# Patient Record
Sex: Female | Born: 1985 | Race: White | Hispanic: No | Marital: Single | Smoking: Current every day smoker
Health system: Southern US, Community
[De-identification: ages and names within clinical notes are randomized; demographics above are authoritative.]

## PROBLEM LIST (undated history)

## (undated) DIAGNOSIS — E785 Hyperlipidemia, unspecified: Secondary | ICD-10-CM

## (undated) DIAGNOSIS — G43909 Migraine, unspecified, not intractable, without status migrainosus: Secondary | ICD-10-CM

## (undated) DIAGNOSIS — M1990 Unspecified osteoarthritis, unspecified site: Secondary | ICD-10-CM

## (undated) DIAGNOSIS — R000 Tachycardia, unspecified: Secondary | ICD-10-CM

## (undated) DIAGNOSIS — M797 Fibromyalgia: Secondary | ICD-10-CM

## (undated) DIAGNOSIS — J40 Bronchitis, not specified as acute or chronic: Secondary | ICD-10-CM

## (undated) DIAGNOSIS — F4310 Post-traumatic stress disorder, unspecified: Secondary | ICD-10-CM

## (undated) DIAGNOSIS — G4700 Insomnia, unspecified: Secondary | ICD-10-CM

## (undated) DIAGNOSIS — K589 Irritable bowel syndrome without diarrhea: Secondary | ICD-10-CM

## (undated) DIAGNOSIS — F419 Anxiety disorder, unspecified: Secondary | ICD-10-CM

## (undated) DIAGNOSIS — J45909 Unspecified asthma, uncomplicated: Secondary | ICD-10-CM

## (undated) DIAGNOSIS — I10 Essential (primary) hypertension: Secondary | ICD-10-CM

## (undated) DIAGNOSIS — K219 Gastro-esophageal reflux disease without esophagitis: Secondary | ICD-10-CM

## (undated) HISTORY — PX: FINGER SURGERY: SHX640

## (undated) HISTORY — DX: Tachycardia, unspecified: R00.0

## (undated) HISTORY — DX: Post-traumatic stress disorder, unspecified: F43.10

## (undated) HISTORY — PX: WISDOM TOOTH EXTRACTION: SHX21

## (undated) HISTORY — DX: Gastro-esophageal reflux disease without esophagitis: K21.9

## (undated) HISTORY — DX: Insomnia, unspecified: G47.00

---
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
## 2004-09-15 ENCOUNTER — Ambulatory Visit: Payer: Self-pay | Admitting: Family Medicine

## 2005-08-12 ENCOUNTER — Ambulatory Visit: Payer: Self-pay | Admitting: Family Medicine

## 2009-02-23 ENCOUNTER — Emergency Department (HOSPITAL_COMMUNITY): Admission: EM | Admit: 2009-02-23 | Discharge: 2009-02-23 | Payer: Self-pay | Admitting: Emergency Medicine
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
## 2015-12-08 ENCOUNTER — Encounter (HOSPITAL_COMMUNITY): Payer: Self-pay | Admitting: Emergency Medicine

## 2015-12-08 ENCOUNTER — Emergency Department (HOSPITAL_COMMUNITY)
Admission: EM | Admit: 2015-12-08 | Discharge: 2015-12-08 | Disposition: A | Discharge status: Home / Self Care | Payer: Medicaid Other | Attending: Emergency Medicine | Admitting: Emergency Medicine

## 2015-12-08 DIAGNOSIS — F419 Anxiety disorder, unspecified: Secondary | ICD-10-CM | POA: Insufficient documentation

## 2015-12-08 DIAGNOSIS — J45909 Unspecified asthma, uncomplicated: Secondary | ICD-10-CM | POA: Insufficient documentation

## 2015-12-08 DIAGNOSIS — R290 Tetany: Secondary | ICD-10-CM | POA: Diagnosis not present

## 2015-12-08 DIAGNOSIS — E785 Hyperlipidemia, unspecified: Secondary | ICD-10-CM | POA: Diagnosis not present

## 2015-12-08 DIAGNOSIS — F17200 Nicotine dependence, unspecified, uncomplicated: Secondary | ICD-10-CM | POA: Diagnosis not present

## 2015-12-08 DIAGNOSIS — I10 Essential (primary) hypertension: Secondary | ICD-10-CM | POA: Diagnosis not present

## 2015-12-08 DIAGNOSIS — R202 Paresthesia of skin: Secondary | ICD-10-CM | POA: Diagnosis present

## 2015-12-08 HISTORY — DX: Fibromyalgia: M79.7

## 2015-12-08 HISTORY — DX: Bronchitis, not specified as acute or chronic: J40

## 2015-12-08 HISTORY — DX: Unspecified asthma, uncomplicated: J45.909

## 2015-12-08 HISTORY — DX: Migraine, unspecified, not intractable, without status migrainosus: G43.909

## 2015-12-08 HISTORY — DX: Irritable bowel syndrome without diarrhea: K58.9

## 2015-12-08 HISTORY — DX: Essential (primary) hypertension: I10

## 2015-12-08 HISTORY — DX: Anxiety disorder, unspecified: F41.9

## 2015-12-08 HISTORY — DX: Unspecified osteoarthritis, unspecified site: M19.90

## 2015-12-08 HISTORY — DX: Hyperlipidemia, unspecified: E78.5

## 2015-12-08 NOTE — ED Notes (Signed)
Sudden onset of tingling all over, hands cramping, chest pain, SOB,  feelings of anxiety at approximately 12 noon. Pt drove herself to urgent care who then called EMS due to  EKG machine not working. C/O feeling tense and jittery at this time. No pain.

## 2015-12-08 NOTE — ED Provider Notes (Signed)
CSN: [id number]     Arrival date & time 12/08/15  1335 History   First MD Initiated Contact with Patient 12/08/15 1513     Chief Complaint  Patient presents with  . Anxiety     HPI Patient presents to the emergency department with complaints of tingling and cramping throughout her hands after he is coming up to the level of her chest.  She also reports a tingling sensation around her mouth.  She states over the past year she is intermittently had issues where she develops inability to flex or extend at the thumbs right greater than left.  She's currently on Neurontin.  She has seen a neurologist in the past but no clear answer was given as to her intermittent symptoms.  She states she occasionally has a spasm-like pain in the right lateral cervical spine region.  She carries a diagnosis of fibromyalgia, anxiety, IBS, arthritis, migraine.  She states her medications all seem to help.  She went to an urgent care today when her symptoms became worse and the EKG machine was not working and she was complaining of chest discomfort at the time thus they sent her to the ER for evaluation.  Patient reports she is feeling much better at this time.  No chest pain shortness breath.  No fevers or chills.   Past Medical History  Diagnosis Date  . Asthma   . Arthritis   . Hypertension   . Fibromyalgia   . Anxiety   . Irritable bowel syndrome   . Bronchitis   . Migraine   . Hyperlipidemia    Past Surgical History  Procedure Laterality Date  . Finger surgery     No family history on file. Social History  Substance Use Topics  . Smoking status: Current Every Day Smoker  . Smokeless tobacco: None  . Alcohol Use: Yes   OB History    No data available     Review of Systems  All other systems reviewed and are negative.     Allergies  Mucinex  Home Medications   Prior to Admission medications   Not on File   BP 175/99 mmHg  Pulse 84  Temp(Src) 98 F (36.7 C) (Oral)  Resp 20  Ht 5'  5" (1.651 m)  Wt 200 lb (90.719 kg)  BMI 33.28 kg/m2  SpO2 100%  LMP 12/08/2015 Physical Exam  Constitutional: She is oriented to person, place, and time. She appears well-developed and well-nourished. No distress.  HENT:  Head: Normocephalic and atraumatic.  Eyes: EOM are normal.  Neck: Normal range of motion.  C-spine nontender.  No cervical step-off.  Cardiovascular: Normal rate, regular rhythm and normal heart sounds.   Pulmonary/Chest: Effort normal and breath sounds normal.  Abdominal: Soft. She exhibits no distension. There is no tenderness.  Musculoskeletal: Normal range of motion.  5 out of 5 strength in bilateral upper extremity major muscle groups.  Normal grip strength bilaterally.  Neurological: She is alert and oriented to person, place, and time.  Skin: Skin is warm and dry.  Psychiatric: She has a normal mood and affect. Judgment normal.  Nursing note and vitals reviewed.   ED Course  Procedures (including critical care time) Labs Review Labs Reviewed - No data to display  Imaging Review No results found. I have personally reviewed and evaluated these images and lab results as part of my medical decision-making.   EKG Interpretation   Date/Time:  Tuesday December 08 2015 13:39:31 EST Ventricular Rate:  1986  PR Interval:  138 QRS Duration: 86 QT Interval:  404 QTC Calculation: 483 R Axis:   51 Text Interpretation:  Normal sinus rhythm with sinus arrhythmia  Nonspecific ST abnormality Prolonged QT Abnormal ECG No old tracing to  compare Confirmed by Shelvia Fojtik  MD, Caryn Bee ([id number]) on 12/08/2015 3:13:59 PM      MDM   Final diagnoses:  Carpopedal spasm    Patiently to follow-up as an outpatient with neurology as she may benefit from nerve conduction testing.  This is been intermittent issue for over a year.  Her episode today sounds like it began without followed by a panic attack with associated carpopedal spasm and hyperventilation.  She is much better at this  time.  Doubt stroke.  Doubt ACS.  No indication for additional workup in the emergency department today.    Azalia Bilis, MD 12/08/15 [id number]

## 2015-12-10 ENCOUNTER — Ambulatory Visit: Payer: Self-pay | Admitting: Neurology

## 2015-12-10 ENCOUNTER — Encounter: Payer: Self-pay | Admitting: Neurology

## 2015-12-10 ENCOUNTER — Telehealth: Payer: Self-pay | Admitting: *Deleted

## 2015-12-10 ENCOUNTER — Ambulatory Visit (INDEPENDENT_AMBULATORY_CARE_PROVIDER_SITE_OTHER): Payer: Medicaid Other | Admitting: Neurology

## 2015-12-10 VITALS — BP 140/102 | HR 84 | Resp 20 | Ht 65.0 in | Wt 203.0 lb

## 2015-12-10 DIAGNOSIS — R252 Cramp and spasm: Secondary | ICD-10-CM | POA: Diagnosis not present

## 2015-12-10 DIAGNOSIS — R200 Anesthesia of skin: Secondary | ICD-10-CM

## 2015-12-10 DIAGNOSIS — R4781 Slurred speech: Secondary | ICD-10-CM | POA: Insufficient documentation

## 2015-12-10 MED ORDER — GABAPENTIN 100 MG PO CAPS: 300.0000 mg | ORAL_CAPSULE | Freq: Three times a day (TID) | ORAL | Status: DC

## 2015-12-10 NOTE — Telephone Encounter (Signed)
Called pt to see if she would be willing to r/s to 4pm to Dr Roda ShuttersXu. She stated she has to pick up her kid from school and this is too late for her. She has a 50 min drive to our office. Advised for her to arrive at 1230pm for 1pm appt. Bring insurance card/medications w/ her to appt. She verbalized understanding.
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
## 2015-12-20 ENCOUNTER — Ambulatory Visit
Admission: RE | Admit: 2015-12-20 | Discharge: 2015-12-20 | Disposition: A | Discharge status: Home / Self Care | Payer: Medicaid Other | Source: Ambulatory Visit | Attending: Neurology | Admitting: Neurology

## 2015-12-20 DIAGNOSIS — R4781 Slurred speech: Secondary | ICD-10-CM | POA: Diagnosis not present

## 2015-12-20 DIAGNOSIS — R200 Anesthesia of skin: Secondary | ICD-10-CM

## 2015-12-20 DIAGNOSIS — R252 Cramp and spasm: Secondary | ICD-10-CM

## 2015-12-21 ENCOUNTER — Encounter: Payer: Self-pay | Admitting: Neurology

## 2015-12-21 ENCOUNTER — Telehealth: Payer: Self-pay | Admitting: Neurology

## 2015-12-21 NOTE — Telephone Encounter (Signed)
MRI not read yet, patient notified.

## 2015-12-21 NOTE — Telephone Encounter (Signed)
Patient called to request results of 12/20/15 MRI, [facility] advised patient that MRI was sent instantly to Dr. Terrace ArabiaYan.

## 2015-12-21 NOTE — Telephone Encounter (Signed)
I have personally reviewed MRI of the brain, lumbar, cervical spine, there was no significant abnormality, formal report is pending, will release the result to my chart

## 2015-12-31 ENCOUNTER — Ambulatory Visit (INDEPENDENT_AMBULATORY_CARE_PROVIDER_SITE_OTHER): Payer: Medicaid Other | Admitting: Neurology

## 2015-12-31 ENCOUNTER — Ambulatory Visit: Payer: Medicaid Other | Admitting: Neurology

## 2015-12-31 ENCOUNTER — Encounter: Payer: Self-pay | Admitting: Neurology

## 2015-12-31 VITALS — BP 142/99 | HR 83 | Resp 18 | Ht 65.0 in | Wt 207.0 lb

## 2015-12-31 DIAGNOSIS — R200 Anesthesia of skin: Secondary | ICD-10-CM

## 2015-12-31 DIAGNOSIS — R202 Paresthesia of skin: Secondary | ICD-10-CM | POA: Diagnosis not present

## 2015-12-31 DIAGNOSIS — R4781 Slurred speech: Secondary | ICD-10-CM
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
## 2018-07-17 ENCOUNTER — Ambulatory Visit: Payer: Self-pay | Admitting: Neurology

## 2018-07-17 ENCOUNTER — Encounter: Payer: Self-pay | Admitting: Neurology

## 2018-07-17 ENCOUNTER — Ambulatory Visit: Payer: Medicaid Other | Admitting: Neurology

## 2018-07-17 VITALS — BP 130/96 | HR 84 | Ht 65.0 in | Wt 191.0 lb

## 2018-07-17 DIAGNOSIS — IMO0002 Reserved for concepts with insufficient information to code with codable children: Secondary | ICD-10-CM

## 2018-07-17 DIAGNOSIS — M545 Low back pain, unspecified: Secondary | ICD-10-CM | POA: Insufficient documentation

## 2018-07-17 DIAGNOSIS — G43709 Chronic migraine without aura, not intractable, without status migrainosus: Secondary | ICD-10-CM

## 2018-07-17 DIAGNOSIS — R200 Anesthesia of skin: Secondary | ICD-10-CM | POA: Diagnosis not present

## 2018-07-17 MED ORDER — RIZATRIPTAN BENZOATE 5 MG PO TBDP: 5.0000 mg | ORAL_TABLET | ORAL | 6 refills | Status: DC | PRN
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
## 2018-07-17 NOTE — Patient Instructions (Signed)
Magnesium oxide 453m  Riboflavin 100mg  twice a day as migraine prevention

## 2018-07-18 LAB — CK: CK TOTAL: 79 U/L (ref 24–173)

## 2018-07-18 LAB — VITAMIN D 25 HYDROXY (VIT D DEFICIENCY, FRACTURES): VIT D 25 HYDROXY: 33.7 ng/mL (ref 30.0–100.0)

## 2018-07-18 LAB — THYROID PANEL WITH TSH
FREE THYROXINE INDEX: 2.3 (ref 1.2–4.9)
T3 UPTAKE RATIO: 27 % (ref 24–39)
T4 TOTAL: 8.4 ug/dL (ref 4.5–12.0)
TSH: 1.9 u[IU]/mL (ref 0.450–4.500)

## 2018-10-22 ENCOUNTER — Other Ambulatory Visit (HOSPITAL_COMMUNITY): Payer: Self-pay | Admitting: *Deleted

## 2018-10-23 ENCOUNTER — Ambulatory Visit (HOSPITAL_COMMUNITY)
Admission: RE | Admit: 2018-10-23 | Discharge: 2018-10-23 | Disposition: A | Discharge status: Home / Self Care | Payer: Medicaid Other | Source: Ambulatory Visit | Attending: Nephrology | Admitting: Nephrology

## 2018-10-23 MED ORDER — MAGNESIUM SULFATE 4 GM/100ML IV SOLN
4.0000 g | Freq: Once | INTRAVENOUS | Status: AC
  Administered 2018-10-23: 4 g via INTRAVENOUS
  Filled 2018-10-23: qty 100

## 2018-11-19 ENCOUNTER — Encounter (HOSPITAL_COMMUNITY): Payer: Self-pay

## 2018-11-19 ENCOUNTER — Encounter (HOSPITAL_COMMUNITY)
Admission: RE | Admit: 2018-11-19 | Discharge: 2018-11-19 | Disposition: A | Discharge status: Home / Self Care | Payer: Medicaid Other | Source: Ambulatory Visit | Attending: Nephrology | Admitting: Nephrology

## 2018-11-19 MED ORDER — SODIUM CHLORIDE 0.9 % IV SOLN
Freq: Once | INTRAVENOUS | Status: AC
  Administered 2018-11-19: 09:00:00 via INTRAVENOUS

## 2018-11-19 MED ORDER — MAGNESIUM SULFATE 4 GM/100ML IV SOLN
4.0000 g | Freq: Once | INTRAVENOUS | Status: AC
  Administered 2018-11-19: 4 g via INTRAVENOUS
  Filled 2018-11-19: qty 100

## 2018-11-21 ENCOUNTER — Encounter (HOSPITAL_COMMUNITY): Payer: Medicaid Other

## 2019-01-16 ENCOUNTER — Encounter: Payer: Self-pay | Admitting: *Deleted

## 2019-01-16 ENCOUNTER — Telehealth: Payer: Self-pay | Admitting: Neurology

## 2019-01-16 NOTE — Telephone Encounter (Signed)
Pt called in and stated she has been in a lot of pain in her lower back and in her groin area she states its hard for her to lift her leg up also

## 2019-01-16 NOTE — Telephone Encounter (Signed)
I returned the call to the patient.  She is having increased pain and would like to be seen.  She has been scheduled for a virtual visit with Dr. Terrace Arabia on 01/17/2019 at 9:30am. Her medication list has been updated.

## 2019-01-17 ENCOUNTER — Encounter: Payer: Self-pay | Admitting: Neurology

## 2019-01-17 ENCOUNTER — Ambulatory Visit (INDEPENDENT_AMBULATORY_CARE_PROVIDER_SITE_OTHER): Payer: Medicaid Other | Admitting: Neurology

## 2019-01-17 ENCOUNTER — Other Ambulatory Visit: Payer: Self-pay

## 2019-01-17 DIAGNOSIS — G43709 Chronic migraine without aura, not intractable, without status migrainosus: Secondary | ICD-10-CM

## 2019-01-17 DIAGNOSIS — M545 Low back pain, unspecified: Secondary | ICD-10-CM

## 2019-01-17 DIAGNOSIS — IMO0002 Reserved for concepts with insufficient information to code with codable children: Secondary | ICD-10-CM

## 2019-01-17 MED ORDER — GABAPENTIN 300 MG PO CAPS: 300.0000 mg | ORAL_CAPSULE | Freq: Three times a day (TID) | ORAL | 11 refills | Status: DC

## 2019-01-17 MED ORDER — RIZATRIPTAN BENZOATE 5 MG PO TBDP: 5.0000 mg | ORAL_TABLET | ORAL | 11 refills | Status: DC | PRN
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
## 2019-02-13 ENCOUNTER — Telehealth: Payer: Self-pay | Admitting: Neurology

## 2019-02-13 NOTE — Telephone Encounter (Signed)
-----   Message from Levert Feinstein, MD sent at 01/17/2019  9:45 AM EDT ----- prn

## 2019-02-13 NOTE — Telephone Encounter (Signed)
Patient will follow as needed.  °

## 2019-07-08 ENCOUNTER — Encounter (HOSPITAL_COMMUNITY): Payer: Medicaid Other

## 2019-07-08 ENCOUNTER — Other Ambulatory Visit (HOSPITAL_COMMUNITY): Payer: Self-pay

## 2019-07-09 ENCOUNTER — Other Ambulatory Visit: Payer: Self-pay

## 2019-07-09 ENCOUNTER — Ambulatory Visit (HOSPITAL_COMMUNITY)
Admission: RE | Admit: 2019-07-09 | Discharge: 2019-07-09 | Disposition: A | Discharge status: Home / Self Care | Payer: Medicaid Other | Source: Ambulatory Visit | Attending: Nephrology | Admitting: Nephrology

## 2019-07-09 MED ORDER — MAGNESIUM SULFATE 4 GM/100ML IV SOLN
4.0000 g | Freq: Once | INTRAVENOUS | Status: AC
  Administered 2019-07-09: 4 g via INTRAVENOUS
  Filled 2019-07-09: qty 100

## 2019-07-26 ENCOUNTER — Other Ambulatory Visit: Payer: Self-pay | Admitting: Neurology

## 2019-08-06 ENCOUNTER — Other Ambulatory Visit (HOSPITAL_COMMUNITY): Payer: Self-pay | Admitting: *Deleted

## 2019-08-07 ENCOUNTER — Encounter (HOSPITAL_COMMUNITY)
Admission: RE | Admit: 2019-08-07 | Discharge: 2019-08-07 | Disposition: A | Discharge status: Home / Self Care | Payer: Medicaid Other | Source: Ambulatory Visit | Attending: Nephrology | Admitting: Nephrology

## 2019-08-07 ENCOUNTER — Other Ambulatory Visit: Payer: Self-pay

## 2019-08-07 MED ORDER — MAGNESIUM SULFATE 4 GM/100ML IV SOLN
4.0000 g | Freq: Once | INTRAVENOUS | Status: AC
  Administered 2019-08-07: 4 g via INTRAVENOUS
  Filled 2019-08-07: qty 100

## 2020-02-05 ENCOUNTER — Other Ambulatory Visit: Payer: Self-pay | Admitting: Neurology

## 2020-02-10 ENCOUNTER — Telehealth: Payer: Self-pay | Admitting: Neurology

## 2020-02-10 MED ORDER — GABAPENTIN 300 MG PO CAPS: 300.0000 mg | ORAL_CAPSULE | Freq: Three times a day (TID) | ORAL | 0 refills | Status: DC

## 2020-02-10 MED ORDER — RIZATRIPTAN BENZOATE 5 MG PO TBDP: 5.0000 mg | ORAL_TABLET | ORAL | 0 refills | Status: DC | PRN

## 2020-02-10 NOTE — Telephone Encounter (Signed)
One month refill sent for gabapentin and maxalt until pt can come for her appt. PCP may be able to take over RXs since f/u with [facility] is PRN.

## 2020-02-10 NOTE — Telephone Encounter (Signed)
1) Medication(s) Requested (by name): rizatriptan (MAXALT-MLT) 5 MG disintegrating tablet  gabapentin (NEURONTIN) 300 MG capsule   2) Pharmacy of Choice: [facility] [phone] - [place], [place] - [address]  [address] [place], [place] [address]

## 2020-03-15 ENCOUNTER — Other Ambulatory Visit: Payer: Self-pay | Admitting: Neurology

## 2020-03-19 ENCOUNTER — Telehealth: Payer: Self-pay | Admitting: Neurology

## 2020-03-19 ENCOUNTER — Telehealth: Payer: Medicaid Other | Admitting: Neurology

## 2020-03-19 ENCOUNTER — Telehealth (INDEPENDENT_AMBULATORY_CARE_PROVIDER_SITE_OTHER): Payer: Medicaid Other | Admitting: Neurology

## 2020-03-19 DIAGNOSIS — IMO0002 Reserved for concepts with insufficient information to code with codable children: Secondary | ICD-10-CM

## 2020-03-19 DIAGNOSIS — R200 Anesthesia of skin: Secondary | ICD-10-CM | POA: Diagnosis not present

## 2020-03-19 DIAGNOSIS — M545 Low back pain, unspecified: Secondary | ICD-10-CM

## 2020-03-19 DIAGNOSIS — G43709 Chronic migraine without aura, not intractable, without status migrainosus: Secondary | ICD-10-CM | POA: Diagnosis not present

## 2020-03-19 MED ORDER — RIZATRIPTAN BENZOATE 10 MG PO TBDP: 10.0000 mg | ORAL_TABLET | ORAL | 11 refills | Status: DC | PRN

## 2020-03-19 MED ORDER — VENLAFAXINE HCL ER 37.5 MG PO CP24: 37.5000 mg | ORAL_CAPSULE | Freq: Every day | ORAL | 11 refills | Status: AC

## 2020-03-19 MED ORDER — GABAPENTIN 300 MG PO CAPS: 300.0000 mg | ORAL_CAPSULE | Freq: Four times a day (QID) | ORAL | 4 refills | Status: DC
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
## 2020-03-19 NOTE — Telephone Encounter (Signed)
Pt gave consent for insurance to be filed for vv  Pt understands that although there may be some limitations with this type of visit, we will take all precautions to reduce any security or privacy concerns.  Pt understands that this will be treated like an in office visit and we will file with pt's insurance, and there may be a patient responsible charge related to this service.  

## 2020-03-22 ENCOUNTER — Encounter: Payer: Self-pay | Admitting: Neurology

## 2020-03-23 ENCOUNTER — Other Ambulatory Visit: Payer: Self-pay | Admitting: *Deleted

## 2020-03-23 MED ORDER — GABAPENTIN 300 MG PO CAPS: 300.0000 mg | ORAL_CAPSULE | Freq: Four times a day (QID) | ORAL | 4 refills | Status: DC

## 2020-05-13 ENCOUNTER — Encounter: Payer: Self-pay | Admitting: Neurology

## 2020-05-13 ENCOUNTER — Ambulatory Visit (INDEPENDENT_AMBULATORY_CARE_PROVIDER_SITE_OTHER): Payer: Medicaid Other | Admitting: Neurology

## 2020-05-13 DIAGNOSIS — M545 Low back pain, unspecified: Secondary | ICD-10-CM

## 2020-05-13 DIAGNOSIS — R200 Anesthesia of skin: Secondary | ICD-10-CM

## 2020-05-13 DIAGNOSIS — Z0279 Encounter for issue of other medical certificate: Secondary | ICD-10-CM

## 2020-05-13 DIAGNOSIS — IMO0002 Reserved for concepts with insufficient information to code with codable children: Secondary | ICD-10-CM

## 2020-05-13 NOTE — Procedures (Addendum)
Full Name: Annette Lopez Gender: Female MRN #: [id number] Date of Birth: Jun 16, 1986    Visit Date: 05/13/2020 09:57 Age: 34 Years Examining Physician: Levert Feinstein, MD  Referring Physician: Levert Feinstein, MD Height: 5 feet 5 inch History:  34 year old female complains of chronic worsening right lower back pain   Summary of the test: Nerve conduction study: Bilateral lower extremity sensory motor responses were normal.  Electromyography: Selected needle examination of bilateral lower extremity muscles bilateral lumbosacral paraspinal muscles were normal.  Conclusion: This is a normal study.  There is no electrodiagnostic evidence of bilateral lumbosacral radiculopathy.    ------------------------------- Levert Feinstein, M.D. PhD  [facility] [address] [place], [place] [address] Tel: [phone] Fax: [phone]  Verbal informed consent was obtained from the patient, patient was informed of potential risk of procedure, including bruising, bleeding, hematoma formation, infection, muscle weakness, muscle pain, numbness, among others.         MNC    Nerve / Sites Muscle Latency Ref. Amplitude Ref. Rel Amp Segments Distance Velocity Ref. Area    ms ms mV mV %  cm m/s m/s mVms  L Peroneal - EDB     Ankle EDB 4.2 ?6.5 6.4 ?2.0 100 Ankle - EDB 9   20.2     Fib head EDB 9.8  6.0  93.6 Fib head - Ankle 28 50 ?44 19.4     Pop fossa EDB 11.8  5.9  98.9 Pop fossa - Fib head 10 52 ?44 19.3         Pop fossa - Ankle      R Peroneal - EDB     Ankle EDB 3.8 ?6.5 6.9 ?2.0 100 Ankle - EDB 9   20.5     Fib head EDB 9.3  6.0  87.1 Fib head - Ankle 27 49 ?44 19.4     Pop fossa EDB 11.3  5.6  93.6 Pop fossa - Fib head 10 49 ?44 19.0         Pop fossa - Ankle      L Tibial - AH     Ankle AH 3.9 ?5.8 13.2 ?4.0 100 Ankle - AH 9   27.9     Pop fossa AH 11.9  8.8  66.8 Pop fossa - Ankle 36 45 ?41 24.7  R Tibial - AH     Ankle AH 5.7 ?5.8 9.2 ?4.0 100 Ankle - AH 9   22.1      Pop fossa AH 14.3  7.7  83.7 Pop fossa - Ankle 37 43 ?41 20.6             SNC    Nerve / Sites Rec. Site Peak Lat Ref.  Amp Ref. Segments Distance    ms ms V V  cm  L Sural - Ankle (Calf)     Calf Ankle 3.8 ?4.4 11 ?6 Calf - Ankle 14  R Sural - Ankle (Calf)     Calf Ankle 4.0 ?4.4 13 ?6 Calf - Ankle 14  L Superficial peroneal - Ankle     Lat leg Ankle 2.8 ?4.4 6 ?6 Lat leg - Ankle 14  R Superficial peroneal - Ankle     Lat leg Ankle 3.7 ?4.4 8 ?6 Lat leg - Ankle 14             F  Wave    Nerve F Lat Ref.   ms ms  L Tibial -  AH 45.7 ?56.0  R Tibial - AH 45.0 ?56.0         H Reflex    Nerve H Lat Lat Hmax   ms ms   Left Right Ref. Left Right Ref.  Tibial - Soleus 36.9 34.5 ?35.0 31.4 32.0 ?35.0         EMG Summary Table    Spontaneous MUAP Recruitment  Muscle IA Fib PSW Fasc Other Amp Dur. Poly Pattern  R. Tibialis anterior Normal None None None _______ Normal Normal Normal Normal  R. Tibialis posterior Normal None None None _______ Normal Normal Normal Normal  R. Gastrocnemius (Medial head) Normal None None None _______ Normal Normal Normal Normal  R. Peroneus longus Normal None None None _______ Normal Normal Normal Normal  R. Vastus lateralis Normal None None None _______ Normal Normal Normal Normal  R. Gluteus medius Normal None None None _______ Normal Normal Normal Normal  R. Biceps femoris (short head) Normal None None None _______ Normal Normal Normal Normal  R. Lumbar paraspinals (low) Normal None None None _______ Normal Normal Normal Normal  R. Lumbar paraspinals (mid) Normal None None None _______ Normal Normal Normal Normal  L. Tibialis anterior Normal None None None _______ Normal Normal Normal Normal  L. Tibialis posterior Normal None None None _______ Normal Normal Normal Normal  L. Peroneus longus Normal None None None _______ Normal Normal Normal Normal  L. Gastrocnemius (Medial head) Normal None None None _______ Normal Normal Normal Normal  L. Vastus  lateralis Normal None None None _______ Normal Normal Normal Normal

## 2020-12-30 ENCOUNTER — Telehealth: Payer: Self-pay | Admitting: Neurology

## 2020-12-30 NOTE — Telephone Encounter (Signed)
I spoke to the patient and informed her that we do not offer this treatment. She would like a referral to pain management to discuss this injection. She was instructed to contact her PCP with this request. She appreciated the return call.

## 2020-12-30 NOTE — Telephone Encounter (Signed)
Pt called, have question about an injeciton; Stellate Ganglion Block. An injection on either side of the voicebox. If Dr. Terrace Arabia can not do the injection can she do a referral to a pain management. Would like a call from the nurse.

## 2021-06-05 ENCOUNTER — Other Ambulatory Visit: Payer: Self-pay | Admitting: Neurology

## 2021-06-08 ENCOUNTER — Other Ambulatory Visit: Payer: Self-pay | Admitting: Neurology

## 2021-06-10 ENCOUNTER — Other Ambulatory Visit: Payer: Self-pay | Admitting: Neurology

## 2021-06-14 ENCOUNTER — Telehealth: Payer: Self-pay | Admitting: Neurology

## 2021-06-14 ENCOUNTER — Other Ambulatory Visit: Payer: Self-pay | Admitting: Neurology

## 2021-06-14 NOTE — Telephone Encounter (Signed)
Called patient regarding refills.  Patient has changed PCP (updated in chart) and has appointment with [facility] first of October to establish care.   She said that is who should be refilling her prescriptions.   Sent in 30 day refills to allow patient to be seen  by [facility].   Patient denied further questions, verbalized understanding and expressed appreciation for the phone call.

## 2021-06-14 NOTE — Telephone Encounter (Signed)
Pt called requesting refills for gabapentin (NEURONTIN) 300 MG capsule and rizatriptan (MAXALT-MLT) 10 MG disintegrating tablet. Pharmacy [facility] [phone].

## 2021-06-14 NOTE — Telephone Encounter (Signed)
Refills sent

## 2021-08-09 ENCOUNTER — Telehealth: Payer: Self-pay | Admitting: Neurology
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
## 2021-08-09 NOTE — Telephone Encounter (Signed)
The patient was last seen here 05/13/21. Evaluation for low back pain.   She has continued having a persistent itch after having a lidocaine injection at the office below. States it was performed under x-ray for pain mgt of a T9 fracture. She will need to contact that office for further advice. She verbalized understanding.

## 2021-09-09 ENCOUNTER — Other Ambulatory Visit: Payer: Self-pay | Admitting: Neurology
